# Patient Record
Sex: Female | Born: 1980 | Race: White | Hispanic: No | Marital: Married | Smoking: Former smoker
Health system: Southern US, Community
[De-identification: ages and names within clinical notes are randomized; demographics above are authoritative.]

## PROBLEM LIST (undated history)

## (undated) DIAGNOSIS — F419 Anxiety disorder, unspecified: Secondary | ICD-10-CM

## (undated) DIAGNOSIS — T7840XA Allergy, unspecified, initial encounter: Secondary | ICD-10-CM

## (undated) DIAGNOSIS — C801 Malignant (primary) neoplasm, unspecified: Secondary | ICD-10-CM

## (undated) HISTORY — PX: TONSILLECTOMY: SUR1361

## (undated) HISTORY — PX: BREAST SURGERY: SHX581

## (undated) HISTORY — DX: Malignant (primary) neoplasm, unspecified: C80.1

## (undated) HISTORY — DX: Allergy, unspecified, initial encounter: T78.40XA

## (undated) HISTORY — DX: Anxiety disorder, unspecified: F41.9

---

## 2004-12-18 ENCOUNTER — Other Ambulatory Visit: Admission: RE | Admit: 2004-12-18 | Discharge: 2004-12-18 | Payer: Self-pay | Admitting: Obstetrics and Gynecology

## 2005-01-06 ENCOUNTER — Encounter: Admission: RE | Admit: 2005-01-06 | Discharge: 2005-04-06 | Payer: Self-pay | Admitting: Obstetrics and Gynecology

## 2005-11-04 ENCOUNTER — Emergency Department (HOSPITAL_COMMUNITY): Admission: EM | Admit: 2005-11-04 | Discharge: 2005-11-04 | Payer: Self-pay | Admitting: Emergency Medicine

## 2005-12-07 HISTORY — PX: BREAST REDUCTION SURGERY: SHX8

## 2005-12-23 ENCOUNTER — Other Ambulatory Visit: Admission: RE | Admit: 2005-12-23 | Discharge: 2005-12-23 | Payer: Self-pay | Admitting: Obstetrics & Gynecology

## 2006-01-01 ENCOUNTER — Ambulatory Visit: Payer: Self-pay | Admitting: Infectious Diseases

## 2006-01-15 ENCOUNTER — Ambulatory Visit: Payer: Self-pay | Admitting: Infectious Diseases

## 2007-08-09 ENCOUNTER — Encounter: Payer: Self-pay | Admitting: Internal Medicine

## 2008-11-09 ENCOUNTER — Encounter: Payer: Self-pay | Admitting: Internal Medicine

## 2008-11-12 ENCOUNTER — Encounter: Payer: Self-pay | Admitting: Internal Medicine

## 2008-11-14 ENCOUNTER — Encounter: Payer: Self-pay | Admitting: Internal Medicine

## 2009-01-10 ENCOUNTER — Encounter: Payer: Self-pay | Admitting: Internal Medicine

## 2009-02-05 ENCOUNTER — Encounter: Payer: Self-pay | Admitting: Internal Medicine

## 2009-02-05 ENCOUNTER — Telehealth: Payer: Self-pay | Admitting: Internal Medicine

## 2009-02-12 DIAGNOSIS — G43909 Migraine, unspecified, not intractable, without status migrainosus: Secondary | ICD-10-CM | POA: Insufficient documentation

## 2009-02-12 DIAGNOSIS — Z85820 Personal history of malignant melanoma of skin: Secondary | ICD-10-CM

## 2009-02-12 DIAGNOSIS — A239 Brucellosis, unspecified: Secondary | ICD-10-CM | POA: Insufficient documentation

## 2009-02-13 ENCOUNTER — Ambulatory Visit: Payer: Self-pay | Admitting: Internal Medicine

## 2009-02-13 DIAGNOSIS — K5900 Constipation, unspecified: Secondary | ICD-10-CM | POA: Insufficient documentation

## 2009-02-13 DIAGNOSIS — R1032 Left lower quadrant pain: Secondary | ICD-10-CM | POA: Insufficient documentation

## 2009-02-19 ENCOUNTER — Ambulatory Visit: Payer: Self-pay | Admitting: Internal Medicine

## 2009-02-19 ENCOUNTER — Telehealth: Payer: Self-pay | Admitting: Internal Medicine

## 2009-02-22 ENCOUNTER — Telehealth: Payer: Self-pay | Admitting: Internal Medicine

## 2010-01-02 ENCOUNTER — Emergency Department (HOSPITAL_COMMUNITY): Admission: EM | Admit: 2010-01-02 | Discharge: 2010-01-02 | Payer: Self-pay | Admitting: Emergency Medicine

## 2010-01-05 ENCOUNTER — Emergency Department (HOSPITAL_COMMUNITY): Admission: EM | Admit: 2010-01-05 | Discharge: 2010-01-05 | Payer: Self-pay | Admitting: Family Medicine

## 2010-01-09 ENCOUNTER — Emergency Department (HOSPITAL_COMMUNITY): Admission: EM | Admit: 2010-01-09 | Discharge: 2010-01-09 | Payer: Self-pay | Admitting: Family Medicine

## 2010-01-16 ENCOUNTER — Emergency Department (HOSPITAL_COMMUNITY): Admission: EM | Admit: 2010-01-16 | Discharge: 2010-01-16 | Payer: Self-pay | Admitting: Emergency Medicine

## 2010-07-08 ENCOUNTER — Encounter: Payer: Self-pay | Admitting: Emergency Medicine

## 2010-07-09 ENCOUNTER — Emergency Department (HOSPITAL_COMMUNITY): Admission: EM | Admit: 2010-07-09 | Discharge: 2010-07-09 | Payer: Self-pay | Admitting: Emergency Medicine

## 2010-07-30 ENCOUNTER — Ambulatory Visit: Payer: Self-pay | Admitting: Emergency Medicine

## 2010-07-30 DIAGNOSIS — J45909 Unspecified asthma, uncomplicated: Secondary | ICD-10-CM | POA: Insufficient documentation

## 2010-07-30 DIAGNOSIS — R0602 Shortness of breath: Secondary | ICD-10-CM

## 2010-08-01 ENCOUNTER — Ambulatory Visit: Payer: Self-pay | Admitting: Emergency Medicine

## 2010-08-04 ENCOUNTER — Ambulatory Visit: Payer: Self-pay | Admitting: Licensed Clinical Social Worker

## 2010-08-12 ENCOUNTER — Ambulatory Visit: Payer: Self-pay | Admitting: Licensed Clinical Social Worker

## 2010-09-02 ENCOUNTER — Ambulatory Visit: Payer: Self-pay | Admitting: Licensed Clinical Social Worker

## 2010-09-10 ENCOUNTER — Ambulatory Visit: Payer: Self-pay | Admitting: Licensed Clinical Social Worker

## 2010-09-17 ENCOUNTER — Ambulatory Visit: Payer: Self-pay | Admitting: Licensed Clinical Social Worker

## 2010-09-22 ENCOUNTER — Ambulatory Visit: Payer: Self-pay | Admitting: Licensed Clinical Social Worker

## 2010-10-09 ENCOUNTER — Ambulatory Visit: Payer: Self-pay | Admitting: Licensed Clinical Social Worker

## 2010-10-28 ENCOUNTER — Ambulatory Visit: Payer: Self-pay | Admitting: Licensed Clinical Social Worker

## 2010-11-25 ENCOUNTER — Ambulatory Visit: Payer: Self-pay | Admitting: Licensed Clinical Social Worker

## 2010-12-24 ENCOUNTER — Ambulatory Visit
Admission: RE | Admit: 2010-12-24 | Discharge: 2010-12-24 | Payer: Self-pay | Source: Home / Self Care | Attending: Licensed Clinical Social Worker | Admitting: Licensed Clinical Social Worker

## 2010-12-29 ENCOUNTER — Encounter: Payer: Self-pay | Admitting: Internal Medicine

## 2010-12-31 ENCOUNTER — Ambulatory Visit: Admit: 2010-12-31 | Payer: Self-pay | Admitting: Licensed Clinical Social Worker

## 2011-01-06 NOTE — Assessment & Plan Note (Signed)
Summary: EI asthma, dyspnea   Visit Type:  Follow-up Copy to:  Weber Cooks Clelia Croft M.D. Primary Provider/Referring Provider:  Weber Cooks. Clelia Croft, MD  CC:  Patient here to discuss PFT results..  History of Present Illness: 30 yo former smoker, hx exercise induced asthma dx around age 7, treated with rescue SABA successfully. Also hx panic attacks, allergies. Still uses SABA sometimes, bothersome because it can cause migraines.  Had only ever had intermittant symptoms associated with exertion. Then Aug 1 had episode that started at rest when she was bending over. Started as pain in her chest, ? pressure. Persisted for a few days, was seen and given Advair trial, Pred taper. Seemed to improve, was even better off the Advair trial. Now having some chest tightness, SOB w exertion.    ROV 07/31/10 -- returns to discuss PFT's and symptoms.   Current Medications (verified): 1)  Yaz 3-0.02 Mg Tabs (Drospirenone-Ethinyl Estradiol) .... Take As Directed 2)  Promethazine Hcl 25 Mg Tabs (Promethazine Hcl) .... Take 1 As Needed 3)  Maxalt 10 Mg Tabs (Rizatriptan Benzoate) .... Take 1 Tablet By Mouth As Needed \\par  4)  Xanax 0.25 Mg Tabs (Alprazolam) .... As Needed 5)  Ibuprofen 200 Mg Tabs (Ibuprofen) .... As Needed 6)  Yaz 3-0.02 Mg Tabs (Drospirenone-Ethinyl Estradiol) .... Once Daily 7)  Ps-100 .... Once Daily 8)  Zypan .... Once Daily 9)  Ventolin Hfa 108 (90 Base) Mcg/act Aers (Albuterol Sulfate) .... 2 Puffs Every 4 Hours As Needed  Allergies (verified): 1)  ! Codeine 2)  ! Darvocet  Vital Signs:  Patient profile:   30 year old female Height:      64 inches (162.56 cm) Weight:      134.38 pounds (61.08 kg) BMI:     23.15 O2 Sat:      96 % on Room air Temp:     97.7 degrees F (36.50 degrees C) oral Pulse rate:   77 / minute BP sitting:   110 / 80  (right arm) Cuff size:   regular  Vitals Entered By: Michel Bickers CMA (August 01, 2010 9:36 AM)  O2 Sat at Rest %:  96 O2 Flow:  Room air CC:  Patient here to discuss PFT results. Comments Medications reviewed. Daytime phone verified. Michel Bickers [facility]  August 01, 2010 9:36 AM   Physical Exam  General:  Well developed, well nourished, no acute distress. Head:  Normocephalic and atraumatic. Eyes:  PERRLA, no icterus. Nose:  No deformity, discharge,  or lesions. Mouth:  No deformity or lesions, dentition normal. Lungs:  Clear throughout to auscultation. Heart:  Regular rate and rhythm; no murmurs, rubs,  or bruits. Abdomen:  not examined Msk:  Symmetrical with no gross deformities. Normal posture. Pulses:  Normal pulses noted. Extremities:  No clubbing, cyanosis, edema or deformities noted. Neurologic:  non-focal Skin:  Intact without significant lesions or rashes. Psych:  Alert and cooperative. Normal mood and affect.   Other Orders: Est. Patient Level IV (63875)  Patient Instructions: 1)  Continue to use your loratadine once daily  2)  Be mindful of your anxiety level - it probably relates to your upper airway instability.  3)  Continue to do your yoga and stress control.  4)  We could consider performing another type of lung function testing if we need to prove that you have exercise-induced asthma some time in the future.  5)  For now continue to have your Ventolin available to use before exercise and as needed  6)  Follow up with Dr Delton Coombes as needed  Prescriptions: VENTOLIN HFA 108 (90 BASE) MCG/ACT AERS (ALBUTEROL SULFATE) 2 puffs every 4 hours as needed  #1 x 5   Entered and Authorized by:   Leslye Peer MD   Signed by:   Leslye Peer MD on 08/01/2010   Method used:   Electronically to        [facility] (retail)       [address]       [place], [place]  [id number]       Ph: [phone]       Fax: [phone]   RxID:   [id number]

## 2011-01-06 NOTE — Miscellaneous (Signed)
Summary: Orders Update pft charges  Clinical Lists Changes  Orders: Added new Service order of Carbon Monoxide diffusing w/capacity (94720) - Signed Added new Service order of Lung Volumes (94240) - Signed Added new Service order of Spirometry (Pre & Post) (94060) - Signed 

## 2011-01-06 NOTE — Assessment & Plan Note (Signed)
Summary: dyspnea, ? asthma.    Visit Type:  Initial Consult Copy to:  Self referral Primary Aiyden Lauderback/Referring Zebulon Gantt:  Weber Cooks. Clelia Croft, MD  CC:  Patient c/o increased sob and chest pain/tightness x2-3 weeks. She has a history of exercise induced asthma. 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 Also hx panic attacks, allergies. Still uses SABA sometimes, bothersome because it can cause migraines.  Had only ever had intermittant symptoms associated with exertion. Then Aug 1 had episode that started at rest when she was bending over. Started as pain in her chest, ? pressure. Persisted for a few days, was seen and given Advair trial, Pred taper. Seemed to improve, was even better off the Advair trial. Now having some chest tightness, SOB w exertion.    Preventive Screening-Counseling & Management  Alcohol-Tobacco     Alcohol drinks/day: <1     Smoking Status: quit     Packs/Day: 0.5     Year Started: 2000     Year Quit: 2006     Pack years: 3  Current Medications (verified): 1)  Yaz 3-0.02 Mg Tabs (Drospirenone-Ethinyl Estradiol) .... Take As Directed 2)  Promethazine Hcl 25 Mg Tabs (Promethazine Hcl) .... Take 1 As Needed 3)  Maxalt 10 Mg Tabs (Rizatriptan Benzoate) .... Take 1 Tablet By Mouth As Needed \\par  4)  Xanax 0.25 Mg Tabs (Alprazolam) .... As Needed 5)  Ibuprofen 200 Mg Tabs (Ibuprofen) .... As Needed 6)  Yaz 3-0.02 Mg Tabs (Drospirenone-Ethinyl Estradiol) .... Once Daily 7)  Ps-100 .... Once Daily 8)  Zypan .... Once Daily 9)  Ventolin Hfa 108 (90 Base) Mcg/act Aers (Albuterol Sulfate) .... 2 Puffs Every 4 Hours As Needed  Allergies (verified): 1)  ! Codeine 2)  ! Darvocet  Past History:  Past Surgical History: Last updated: 02/13/2009 Melanoma removed Back -2006 Mammoplasty bilateral - 6433,2951   Tonsillectomy - 2007  Family History: Last updated:  07/30/2010 Family History of Esophageal Cancer:mggm Family History of Celiac Disease:Sister Family History of Irritable Bowel Syndrome:Mother NOTE: Patient's paternal family history is unknown Advertising account executive and siblings  Past Medical History: Current Problems:  ASTHMA (ICD-493.90) CONSTIPATION (ICD-564.00) ABDOMINAL PAIN-LLQ (ICD-789.04) MIGRAINE HEADACHE (ICD-346.90) BRUCELLOSIS (ICD-023.9) MELANOMA, HX OF (ICD-V10.82)  Family History: Family History of Esophageal Cancer:mggm Family History of Celiac Disease:Sister Family History of Irritable Bowel Syndrome:Mother NOTE: Patient's paternal family history is unknown Advertising account executive and siblings  Social History: Occupation: Veterinary surgeon Patient is a former smoker.  Alcohol Use - no Daily Caffeine Use Illicit Drug Use - no married without children Patient states former smoker Packs/Day:  0.5 Pack years:  3 Alcohol drinks/day:  <1  Review of Systems       The patient complains of shortness of breath with activity, shortness of breath at rest, chest pain, and anxiety.  The patient denies productive cough, non-productive cough, coughing up blood, irregular heartbeats, acid heartburn, indigestion, loss of appetite, weight change, abdominal pain, difficulty swallowing, sore throat, tooth/dental problems, headaches, nasal congestion/difficulty breathing through nose, sneezing, itching, ear ache, depression, hand/feet swelling, joint stiffness or pain, rash, change in color of mucus, and fever.    Vital Signs:  Patient profile:   30 year old female Height:      64 inches (162.56 cm) Weight:      136.50 pounds (62.05 kg) BMI:     23.51 O2 Sat:      99 %  on Room air Temp:     98.8 degrees F (37.11 degrees C) oral Pulse rate:   99 / minute BP sitting:   130 / 90  (right arm) Cuff size:   regular  Vitals Entered By: Michel Bickers CMA (July 30, 2010 10:45 AM)  O2 Sat at Rest %:  99 O2 Flow:  Room air CC:  Patient c/o increased sob and chest pain/tightness x2-3 weeks. She has a history of exercise induced asthma. Recent ER visit. Is Patient Diabetic? No Comments Medications reviewed with the patient. Daytime phone verified. Michel Bickers CMA  July 30, 2010 10:46 AM   Physical Exam  General:  normal appearance and healthy appearing.   Head:  normocephalic and atraumatic Eyes:  conjunctiva and sclera clear Nose:  no deformity, discharge, inflammation, or lesions Mouth:  no deformity or lesions Neck:  no masses, thyromegaly, or abnormal cervical nodes Lungs:  clear in normal resp cycle and also on forced expiration.  Heart:  regular rate and rhythm, S1, S2 without murmurs, rubs, gallops, or clicks Abdomen:  not examined Msk:  no deformity or scoliosis noted with normal posture Extremities:  no clubbing, cyanosis, edema, or deformity noted Neurologic:  non-focal Skin:  intact without lesions or rashes Psych:  alert and cooperative; normal mood and affect; normal attention span and concentration   Pulmonary Function Test Date: 07/30/2010 Height (in.): 64 Gender: Female  Pre-Spirometry FVC    Value: 3.60 L/min   Pred: 3.81 L/min     % Pred: 95 % FEV1    Value: 3.00 L     Pred: 3.04 L     % Pred: 99 % FEV1/FVC  Value: 83 %     Pred: 79 %    FEF 25-75  Value: 3.16 L/min   Pred: 3.52 L/min     % Pred: 90 %  Post-Spirometry FVC    Value: 3.57 L/min   Pred: 3.81 L/min     % Pred: 94 % FEV1    Value: 3.06 L     Pred: 3.04 L     % Pred: 101 % FEV1/FVC  Value: 86 %     Pred: 79 %    FEF 25-75  Value: 3.60 L/min   Pred: 3.52 L/min     % Pred: 102 %  Lung Volumes TLC    Value: 4.70 L   % Pred: 91 % RV    Value: 1.10 L   % Pred: 70 % DLCO    Value: 17.1 %   % Pred: 75 % DLCO/VA  Value: 4.18 %   % Pred: 93 %  Impression & Recommendations:  Problem # 1:  DYSPNEA (ICD-786.05)  Orders: Consultation Level IV (30865)  Problem # 2:  ASTHMA (ICD-493.90)  Medications Added to Medication  List This Visit: 1)  Yaz 3-0.02 Mg Tabs (Drospirenone-ethinyl estradiol) .... Once daily 2)  Ps-100  .... Once daily 3)  Zypan  .... Once daily 4)  Ventolin Hfa 108 (90 Base) Mcg/act Aers (Albuterol sulfate) .... 2 puffs every 4 hours as needed  Patient Instructions: 1)  We will perform full Pulmonary function tests today 2)  Follow up with Dr Delton Coombes next available after your testing to review 3)  Do not restart Advair 4)  Continue to have either xopenex or albuterol available to use as needed

## 2011-01-09 ENCOUNTER — Ambulatory Visit (INDEPENDENT_AMBULATORY_CARE_PROVIDER_SITE_OTHER): Payer: Medicare HMO | Admitting: Licensed Clinical Social Worker

## 2011-01-09 DIAGNOSIS — F4323 Adjustment disorder with mixed anxiety and depressed mood: Secondary | ICD-10-CM

## 2011-01-14 ENCOUNTER — Ambulatory Visit (INDEPENDENT_AMBULATORY_CARE_PROVIDER_SITE_OTHER): Payer: Medicare HMO | Admitting: Licensed Clinical Social Worker

## 2011-01-14 DIAGNOSIS — F331 Major depressive disorder, recurrent, moderate: Secondary | ICD-10-CM

## 2011-01-14 DIAGNOSIS — F411 Generalized anxiety disorder: Secondary | ICD-10-CM

## 2011-02-20 LAB — POCT I-STAT, CHEM 8
BUN: 6 mg/dL (ref 6–23)
Calcium, Ion: 1.14 mmol/L (ref 1.12–1.32)
Chloride: 105 mEq/L (ref 96–112)
Creatinine, Ser: 0.9 mg/dL (ref 0.4–1.2)
Glucose, Bld: 95 mg/dL (ref 70–99)
HCT: 42 % (ref 36.0–46.0)
Hemoglobin: 14.3 g/dL (ref 12.0–15.0)
Potassium: 3.6 mEq/L (ref 3.5–5.1)
Sodium: 137 mEq/L (ref 135–145)
TCO2: 22 mmol/L (ref 0–100)

## 2011-02-20 LAB — DIFFERENTIAL
Basophils Absolute: 0.1 10*3/uL (ref 0.0–0.1)
Basophils Relative: 1 % (ref 0–1)
Eosinophils Absolute: 0.1 10*3/uL (ref 0.0–0.7)
Eosinophils Relative: 1 % (ref 0–5)
Lymphocytes Relative: 33 % (ref 12–46)
Lymphs Abs: 3.3 10*3/uL (ref 0.7–4.0)
Monocytes Absolute: 0.4 10*3/uL (ref 0.1–1.0)
Monocytes Relative: 4 % (ref 3–12)
Neutro Abs: 6.4 10*3/uL (ref 1.7–7.7)
Neutrophils Relative %: 62 % (ref 43–77)

## 2011-02-20 LAB — CBC
HCT: 39.3 % (ref 36.0–46.0)
Hemoglobin: 13.2 g/dL (ref 12.0–15.0)
MCH: 29.3 pg (ref 26.0–34.0)
MCHC: 33.6 g/dL (ref 30.0–36.0)
MCV: 87.1 fL (ref 78.0–100.0)
Platelets: 321 10*3/uL (ref 150–400)
RBC: 4.51 MIL/uL (ref 3.87–5.11)
RDW: 13.5 % (ref 11.5–15.5)
WBC: 10.3 10*3/uL (ref 4.0–10.5)

## 2011-03-19 LAB — GLUCOSE, CAPILLARY: Glucose-Capillary: 83 mg/dL (ref 70–99)

## 2011-08-21 IMAGING — CT CT ANGIO CHEST
2 of 6 series · 19 of 36 positions shown · IV contrast (APPLIED)
Comparison: 11/04/2005

CLINICAL DATA: 28-year-old female with shortness of breath

CT ANGIOGRAPHY CHEST WITH CONTRAST
TECHNIQUE: Multidetector CT imaging of the chest was performed
using the standard protocol during bolus administration of
intravenous contrast.  Multiplanar CT image reconstructions
including MIPs were obtained to evaluate the vascular anatomy.
Contrast:  87 ml of Jmnipaque-ZQQ

[Series 9: pulm embolism 1.0 b25f thins · axial · 0.67mm/px · z∈[+1194,+1410]mm · 18 of 241 slices shown]
[im 13/241  lung]
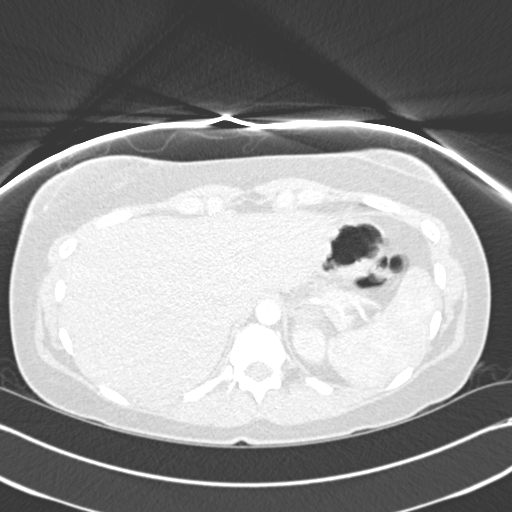
[im 25/241  mediastinal]
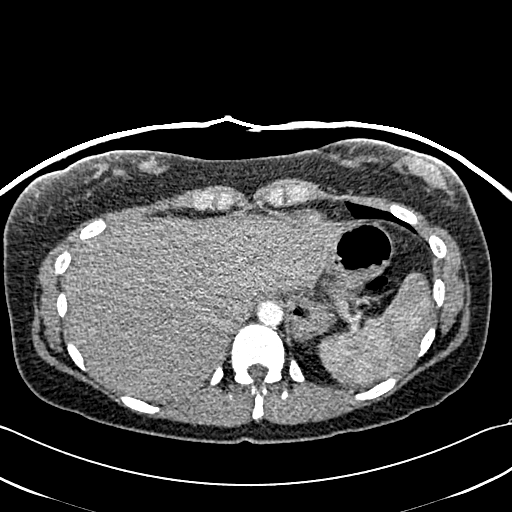
[im 37/241  lung]
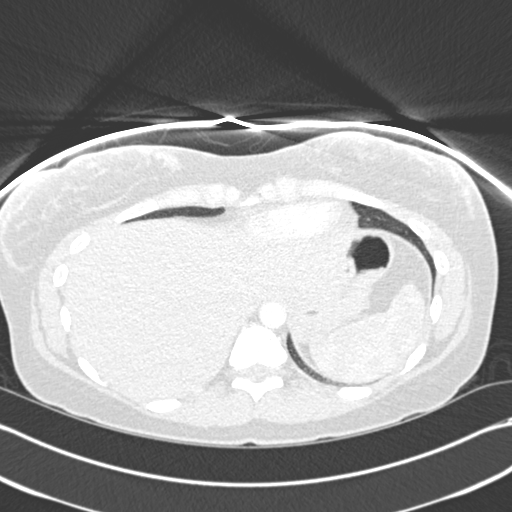
[im 49/241  mediastinal]
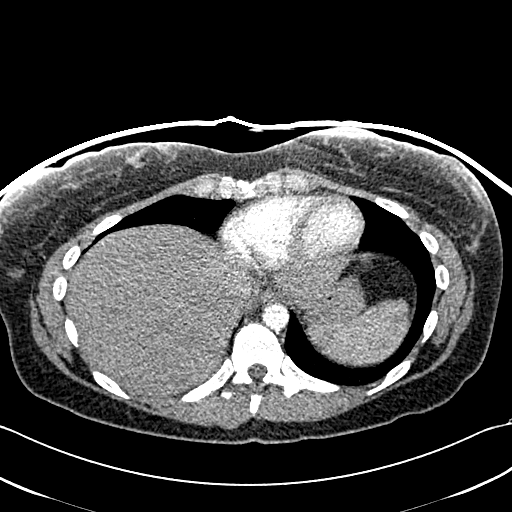
[im 61/241  lung]
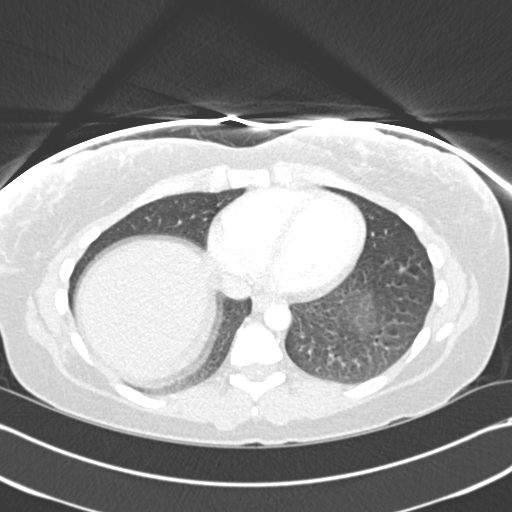
[im 73/241  mediastinal]
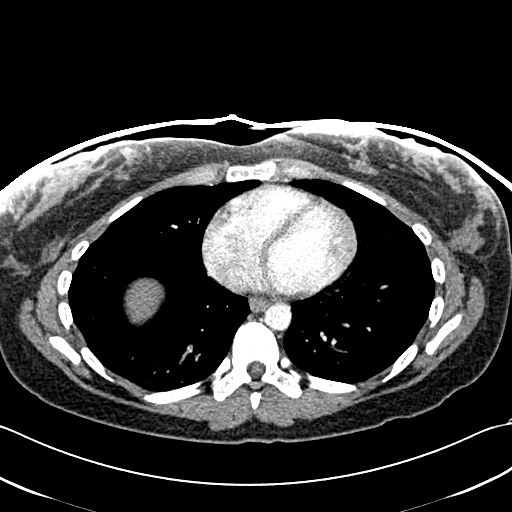
[im 85/241  lung]
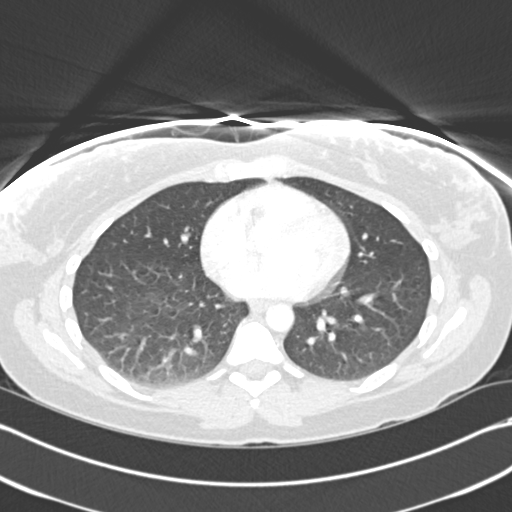
[im 97/241  mediastinal]
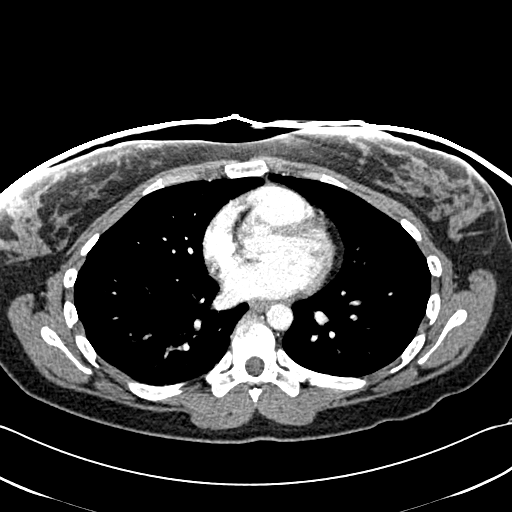
[im 109/241  lung]
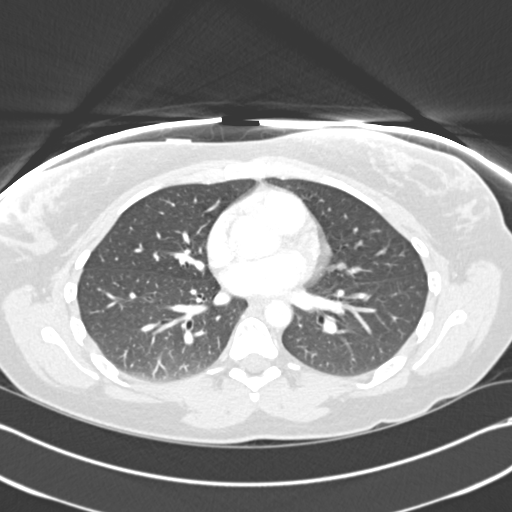
[im 133/241  mediastinal]
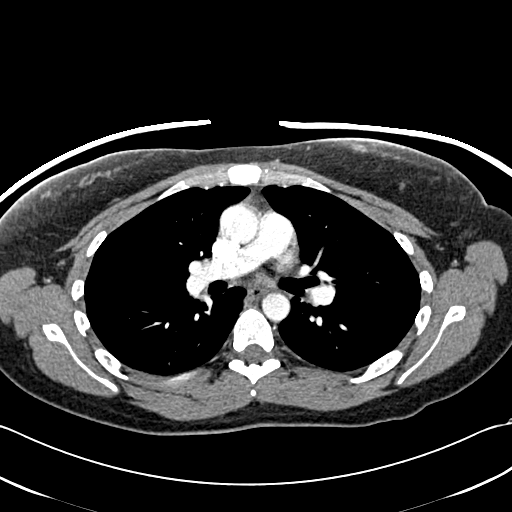
[im 145/241  lung]
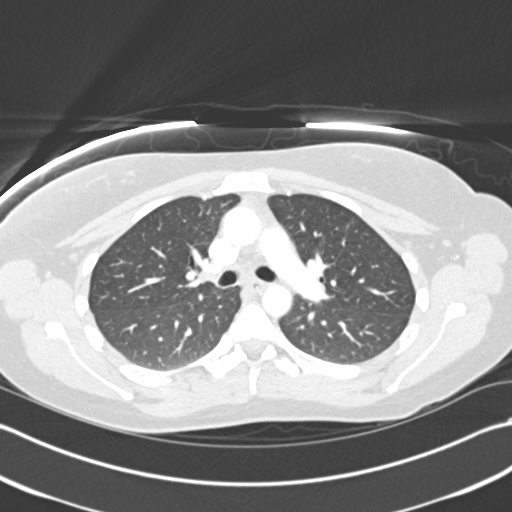
[im 157/241  mediastinal]
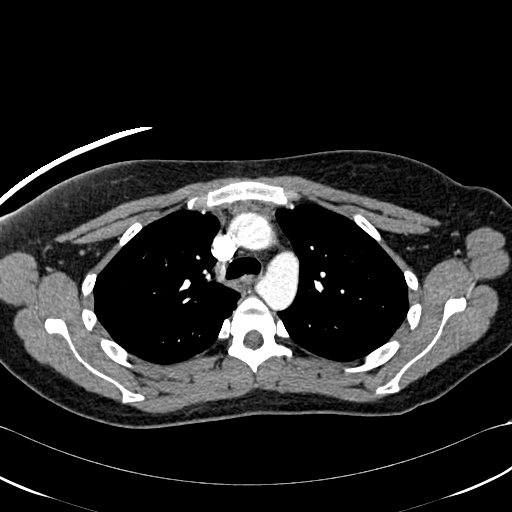
[im 169/241  lung]
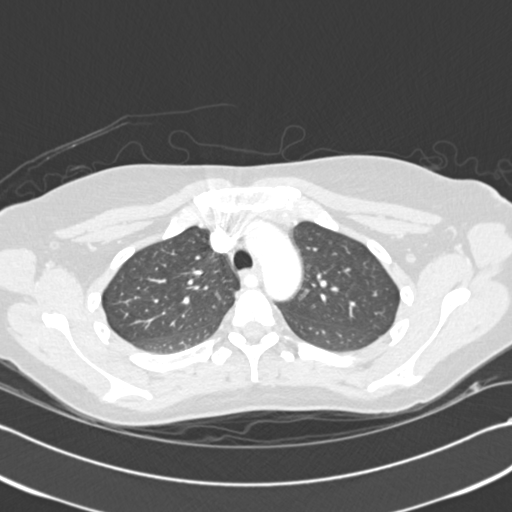
[im 181/241  mediastinal]
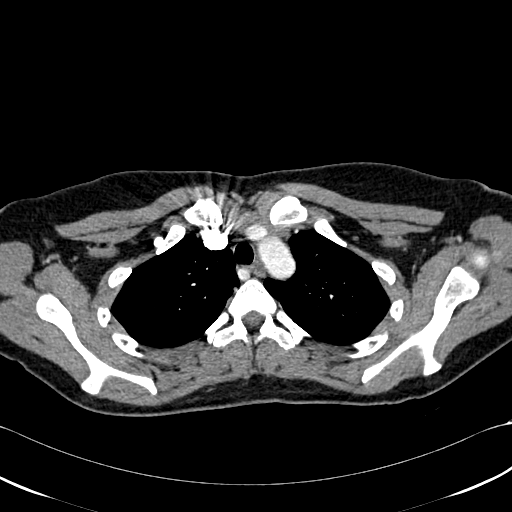
[im 193/241  lung]
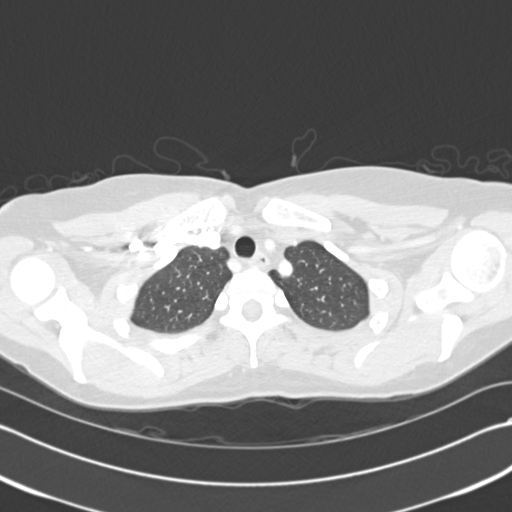
[im 205/241  mediastinal]
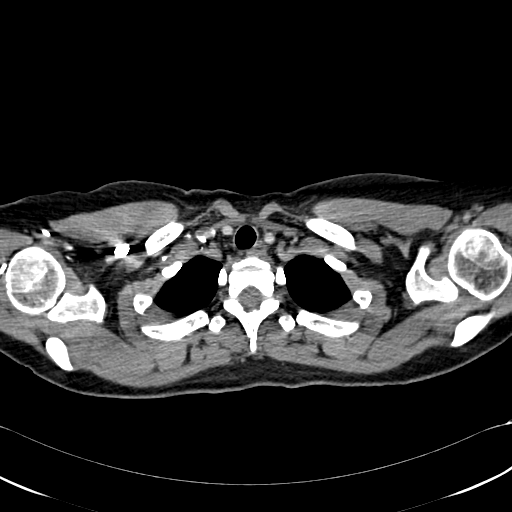
[im 217/241  lung]
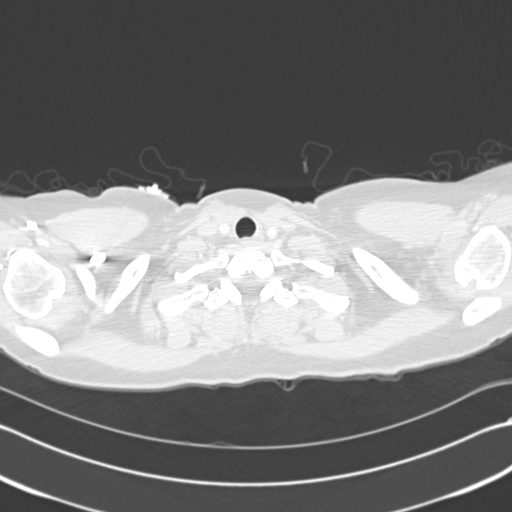
[im 229/241  mediastinal]
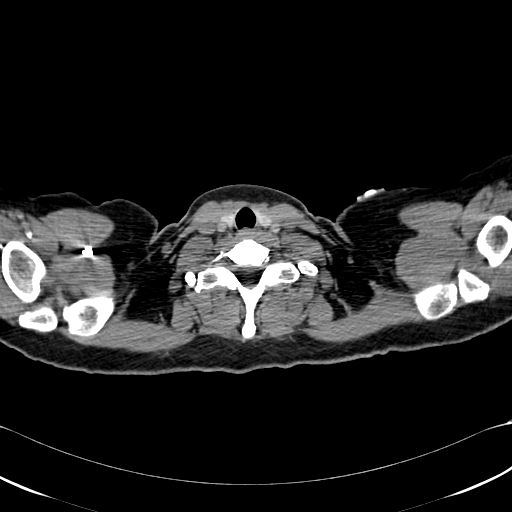

[Series 604: coronal · coronal · 0.67mm/px · 1 of 61 slices shown]
[im 31/61  mediastinal]
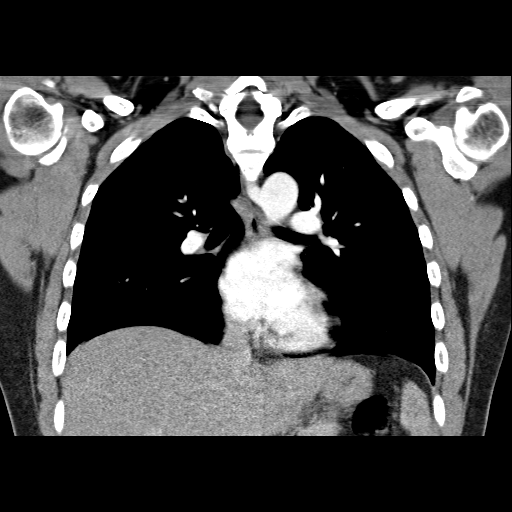

[19 of 36 positions shown; findings below may reference images not displayed]

FINDINGS: The thyroid is normal.  The heart is normal in size
without pericardial effusion.  The aorta and major branch vessels
are patent.  Incidental note is made of aberrant right subclavian
artery.  The pulmonary artery is normal in size without filling
defects seen in the central, segmental or subsegmental pulmonary
arterial tree.

The trachea and central airways are patent.  The lungs are clear
bilaterally.  No pleural effusion is seen.

The upper abdomen is normal appearance.

The osseous structures are within normal limits.  No aggressive
lytic or blastic lesions are seen.

Review of the MIP images confirms the above findings.
IMPRESSION: 1.  No acute findings on today's exam.  Specifically, no evidence
of pulmonary embolism.
2.  Aberrant right subclavian artery detailed above.

## 2011-12-19 ENCOUNTER — Ambulatory Visit (INDEPENDENT_AMBULATORY_CARE_PROVIDER_SITE_OTHER): Payer: 59

## 2011-12-19 DIAGNOSIS — J0100 Acute maxillary sinusitis, unspecified: Secondary | ICD-10-CM

## 2012-01-20 ENCOUNTER — Ambulatory Visit (INDEPENDENT_AMBULATORY_CARE_PROVIDER_SITE_OTHER): Payer: 59 | Admitting: Family Medicine

## 2012-01-20 DIAGNOSIS — F439 Reaction to severe stress, unspecified: Secondary | ICD-10-CM

## 2012-01-20 DIAGNOSIS — J0100 Acute maxillary sinusitis, unspecified: Secondary | ICD-10-CM

## 2012-01-20 DIAGNOSIS — M542 Cervicalgia: Secondary | ICD-10-CM

## 2012-01-20 DIAGNOSIS — I10 Essential (primary) hypertension: Secondary | ICD-10-CM

## 2012-01-20 DIAGNOSIS — G8929 Other chronic pain: Secondary | ICD-10-CM

## 2012-01-20 MED ORDER — CYCLOBENZAPRINE HCL 10 MG PO TABS: 10.0000 mg | ORAL_TABLET | Freq: Every evening | ORAL | Status: AC | PRN

## 2012-01-20 MED ORDER — DICLOFENAC EPOLAMINE 1.3 % TD PTCH: 1.0000 | MEDICATED_PATCH | Freq: Two times a day (BID) | TRANSDERMAL | Status: DC

## 2012-01-20 MED ORDER — MELOXICAM 7.5 MG PO TABS: 7.5000 mg | ORAL_TABLET | Freq: Two times a day (BID) | ORAL | Status: DC | PRN

## 2012-01-20 NOTE — Progress Notes (Signed)
  Subjective:    Patient ID: Rose Harrison, female    DOB: 1981/05/28, 31 y.o.   MRN: [id number]  HPI  Presents c/o recurrent neck spasms.  Her most recent flair started on Saturday.  Spasms were initially  on the right side of her neck, however yesterday began to have spasms involving  her left side.  The patient has used Robaxin and Flexeril in the past, flexeril being the more effective agent.  In the past for her muscular symptoms patient has sought care from chiropractors and rolfing therapists.   Now working with a body work therapist who encouraged the past to get a prescription for flexeril.   She is using a variety of naturalapathic medications to include glucosamine and tumeric.   Lots of emotional stress recently.  Denies radicular symptoms to include paresthesias or weakness in her upper extremities.  Has seen Dr. Ethelene Hal in the past for SI joint dysfunction and flector patches prescribed  PMH / Malignant melanoma surgically excised from mid back in the past.   Review of Systems     Objective:   Physical Exam  Constitutional: She appears well-developed and well-nourished.  Neck: Neck supple.  Cardiovascular: Normal rate, regular rhythm and normal heart sounds.   Pulmonary/Chest: Effort normal and breath sounds normal.  Abdominal: Soft. Bowel sounds are normal.  Musculoskeletal:       Cervical back: She exhibits tenderness (along paraspinal muscles, SCM and trapezius muscles) and spasm. She exhibits normal range of motion and no bony tenderness.       Legs: Neurological: She is alert. She has normal strength. No cranial nerve deficit or sensory deficit.  Reflex Scores:      Bicep reflexes are 2+ on the right side and 2+ on the left side.      Brachioradialis reflexes are 2+ on the right side and 2+ on the left side. Skin: Skin is warm.      Assessment & Plan:   1. Neck pain, muscular  meloxicam (MOBIC) 7.5 MG tablet, cyclobenzaprine (FLEXERIL) 10 MG tablet,  diclofenac (FLECTOR) 1.3 % PTCH  2. Chronic SI joint pain  meloxicam (MOBIC) 7.5 MG tablet, cyclobenzaprine (FLEXERIL) 10 MG tablet, diclofenac (FLECTOR) 1.3 % PTCH  3. Situational stress     Anticipatory guidance. Follow up if symptoms persist or worsen and we'll obtain cervical spine films.  Do not feel they are indicated At this time given normal neurological exam

## 2012-01-22 ENCOUNTER — Encounter: Payer: Self-pay | Admitting: Family Medicine

## 2012-02-09 ENCOUNTER — Other Ambulatory Visit: Payer: Self-pay | Admitting: Family Medicine

## 2012-02-10 NOTE — Telephone Encounter (Signed)
Refill authorized.

## 2012-02-17 ENCOUNTER — Telehealth: Payer: Self-pay

## 2012-02-17 NOTE — Telephone Encounter (Signed)
.  [facility] PT STATES SHE WOULD LIKE TO HAVE 2 EMERGENCY MEDICINES THAT THE DR NEVER GAVE HER HERE, BUT SHE NEED [place] TO CALL IN FOR HER PLEASE CALL [phone]   DOESN'T HAVE THE PHARMACY YET, BUT WILL KNOW WHERE TO CALL WHEN SHE GETS A CALL BACK

## 2012-02-19 ENCOUNTER — Other Ambulatory Visit: Payer: Self-pay | Admitting: Family Medicine

## 2012-02-19 DIAGNOSIS — T782XXA Anaphylactic shock, unspecified, initial encounter: Secondary | ICD-10-CM

## 2012-02-19 DIAGNOSIS — Z91030 Bee allergy status: Secondary | ICD-10-CM

## 2012-02-19 MED ORDER — EPINEPHRINE 0.3 MG/0.3ML IJ DEVI: 0.3000 mg | Freq: Once | INTRAMUSCULAR | Status: AC

## 2012-02-19 MED ORDER — LEVALBUTEROL TARTRATE 45 MCG/ACT IN AERO: 1.0000 | INHALATION_SPRAY | RESPIRATORY_TRACT | Status: AC | PRN

## 2012-02-19 NOTE — Telephone Encounter (Signed)
Allergic to bees.  Did refills for her

## 2012-02-19 NOTE — Telephone Encounter (Signed)
LMOM that Rxs were sent into [facility]

## 2012-02-19 NOTE — Telephone Encounter (Signed)
Would like you to write her a prescription for an Epi pen and a xopenex inhaler.  Prescription can be sent to [facility] pharmacy.

## 2012-05-13 ENCOUNTER — Telehealth: Payer: Self-pay

## 2012-05-13 ENCOUNTER — Other Ambulatory Visit: Payer: Self-pay | Admitting: Physician Assistant

## 2012-05-13 MED ORDER — CYCLOBENZAPRINE HCL 5 MG PO TABS: 5.0000 mg | ORAL_TABLET | Freq: Three times a day (TID) | ORAL | Status: AC | PRN

## 2012-05-13 MED ORDER — MELOXICAM 7.5 MG PO TABS: 7.5000 mg | ORAL_TABLET | Freq: Two times a day (BID) | ORAL | Status: DC

## 2012-05-13 NOTE — Telephone Encounter (Signed)
Pt is requesting anti inflamatory meds for muscle spasms.  [facility]  Previously sent to [facility]  Patient will contact the pharmacy to have them send over a request.  Patient went to chiropractor = the pain has caused her to have a TMJ.

## 2012-05-13 NOTE — Telephone Encounter (Signed)
2 Rxs ready to fax to [facility]

## 2012-05-14 NOTE — Telephone Encounter (Signed)
Faxed and patient notified.  Actually wanted them sent to [facility], so she will call and transfer them.

## 2012-06-23 ENCOUNTER — Ambulatory Visit (INDEPENDENT_AMBULATORY_CARE_PROVIDER_SITE_OTHER): Payer: 59 | Admitting: Family Medicine

## 2012-06-23 VITALS — BP 118/68 | HR 98 | Temp 98.4°F | Resp 16 | Ht 63.5 in | Wt 143.0 lb

## 2012-06-23 DIAGNOSIS — J329 Chronic sinusitis, unspecified: Secondary | ICD-10-CM

## 2012-06-23 MED ORDER — FLUCONAZOLE 150 MG PO TABS: 150.0000 mg | ORAL_TABLET | Freq: Once | ORAL | Status: AC

## 2012-06-23 MED ORDER — CEFDINIR 300 MG PO CAPS: 300.0000 mg | ORAL_CAPSULE | Freq: Two times a day (BID) | ORAL | Status: AC

## 2012-06-23 NOTE — Progress Notes (Signed)
[facility] [address], [place] [address] [phone]  Date:  06/23/2012   Name:  Rose Harrison   DOB:  May 13, 1981   MRN:  [id number]  PCP:  No primary provider on file.    Chief Complaint: Sinusitis   History of Present Illness:  Rose Harrison is a 31 y.o. very pleasant female patient who presents with the following:  Her husband has been ill recently. She has noted sinus congestion, drainage, and purulent sinus drainage for about a week.  She is getting worse instead of better.  OTC medications are not helping much.  She is having drainage and a productive cough. She also notes a ST in the morning.  She has noted hot flushes but no chills or aches.  No GI symptoms.   She is on a 3 month OCP.  She uses the extended regimen to help cut down on her menstrual migraine HA.    Patient Active Problem List  Diagnosis  . BRUCELLOSIS  . MIGRAINE HEADACHE  . ASTHMA  . CONSTIPATION  . DYSPNEA  . ABDOMINAL PAIN-LLQ  . MELANOMA, HX OF    No past medical history on file.  No past surgical history on file.  History  Substance Use Topics  . Smoking status: Former Games developer  . Smokeless tobacco: Not on file  . Alcohol Use: Yes     rarely since Oct. 2012    No family history on file.  Allergies  Allergen Reactions  . Codeine     REACTION: Hives  . Propoxyphene-Acetaminophen     REACTION: hives  . Doxycycline Nausea And Vomiting and Rash    Medication list has been reviewed and updated.  Current Outpatient Prescriptions on File Prior to Visit  Medication Sig Dispense Refill  . drospirenone-ethinyl estradiol (YAZ,GIANVI,LORYNA) 3-0.02 MG tablet Take 1 tablet by mouth daily.      Marland Kitchen EPINEPHrine (EPIPEN) 0.3 mg/0.3 mL DEVI Inject 0.3 mLs (0.3 mg total) into the muscle once.  1 Device  2  . levalbuterol (XOPENEX HFA) 45 MCG/ACT inhaler Inhale 1-2 puffs into the lungs every 4 (four) hours as needed for wheezing.  1 Inhaler  12  . meloxicam (MOBIC) 7.5 MG  tablet Take 1 tablet (7.5 mg total) by mouth 2 (two) times daily.  60 tablet  0  . diclofenac (FLECTOR) 1.3 % PTCH Place 1 patch onto the skin 2 (two) times daily.  5 patch  1    Review of Systems:  As per HPI- otherwise negative.   Physical Examination: Filed Vitals:   06/23/12 1258  BP: 118/68  Pulse: 98  Temp: 98.4 F (36.9 C)  Resp: 16   Filed Vitals:   06/23/12 1258  Height: 5' 3.5" (1.613 m)  Weight: 143 lb (64.864 kg)   Body mass index is 24.93 kg/(m^2). Ideal Body Weight: Weight in (lb) to have BMI = 25: 143.1   GEN: WDWN, NAD, Non-toxic, A & O x 3 HEENT: Atraumatic, Normocephalic. Neck supple. No masses, No LAD.  Tm and oropharynx wnl, nasal cavity congested, tender sinuses Ears and Nose: No external deformity. CV: RRR, No M/G/R. No JVD. No thrill. No extra heart sounds. PULM: CTA B, no wheezes, crackles, rhonchi. No retractions. No resp. distress. No accessory muscle use EXTR: No c/c/e NEURO Normal gait.  PSYCH: Normally interactive. Conversant. Not depressed or anxious appearing.  Calm demeanor.    Assessment and Plan: 1. Sinusitis  cefdinir (OMNICEF) 300 MG capsule, fluconazole (DIFLUCAN) 150 MG  tablet   Treat for sinusitis as above.  Patient (or parent if minor) instructed to return to clinic or call if not better in 3-4 day(s). Sooner if worse.   Gave diflucan for possible yeast vaginitis that tends to occur when she takes abx.     Abbe Amsterdam, MD

## 2012-07-12 ENCOUNTER — Other Ambulatory Visit: Payer: Self-pay | Admitting: Family Medicine

## 2012-07-13 NOTE — Telephone Encounter (Signed)
Chart pulled °

## 2012-09-06 ENCOUNTER — Other Ambulatory Visit: Payer: Self-pay | Admitting: Physician Assistant

## 2012-09-07 ENCOUNTER — Other Ambulatory Visit: Payer: Self-pay | Admitting: Radiology

## 2012-09-10 ENCOUNTER — Other Ambulatory Visit: Payer: Self-pay

## 2012-10-13 ENCOUNTER — Telehealth: Payer: Self-pay | Admitting: Physician Assistant

## 2012-10-13 ENCOUNTER — Ambulatory Visit (INDEPENDENT_AMBULATORY_CARE_PROVIDER_SITE_OTHER): Payer: BC Managed Care – PPO | Admitting: Internal Medicine

## 2012-10-13 VITALS — BP 117/79 | HR 78 | Temp 98.4°F | Resp 16 | Ht 64.0 in | Wt 148.0 lb

## 2012-10-13 DIAGNOSIS — J019 Acute sinusitis, unspecified: Secondary | ICD-10-CM

## 2012-10-13 DIAGNOSIS — R51 Headache: Secondary | ICD-10-CM

## 2012-10-13 MED ORDER — FLUTICASONE PROPIONATE 50 MCG/ACT NA SUSP: 2.0000 | Freq: Every day | NASAL | Status: DC

## 2012-10-13 MED ORDER — AMOXICILLIN 500 MG PO CAPS: 1000.0000 mg | ORAL_CAPSULE | Freq: Two times a day (BID) | ORAL | Status: DC

## 2012-10-13 MED ORDER — FLUCONAZOLE 150 MG PO TABS: 150.0000 mg | ORAL_TABLET | Freq: Once | ORAL | Status: DC

## 2012-10-13 NOTE — Patient Instructions (Signed)

## 2012-10-13 NOTE — Progress Notes (Signed)
  Subjective:    Patient ID: Rose Harrison, female    DOB: [birth year], 31 y.o.   MRN: [id number]  HPI Facial pain and pressure, green discharge, travel   Review of Systems     Objective:   Physical Exam  Vitals reviewed. Constitutional: She is oriented to person, place, and time. She appears well-developed and well-nourished.  HENT:  Head: Normocephalic.  Nose: Mucosal edema and sinus tenderness present. No septal deviation. Left sinus exhibits maxillary sinus tenderness and frontal sinus tenderness.  Eyes: EOM are normal.  Neck: Neck supple.  Cardiovascular: Normal rate.   Pulmonary/Chest: Effort normal.  Neurological: She is alert and oriented to person, place, and time. Coordination normal.  Psychiatric: She has a normal mood and affect.          Assessment & Plan:  Sinusitis

## 2012-10-13 NOTE — Telephone Encounter (Signed)
Pt called stating she saw Dr. Perrin Maltese today who said he was going to prescribe three things for her - abx, diflucan, and fluticasone - but only the antibiotic was at the pharmacy.  Have sent Diflucan and fluticasone.

## 2012-11-23 ENCOUNTER — Other Ambulatory Visit: Payer: Self-pay | Admitting: Otolaryngology

## 2012-11-23 ENCOUNTER — Other Ambulatory Visit: Payer: Self-pay | Admitting: Physician Assistant

## 2012-11-23 DIAGNOSIS — R51 Headache: Secondary | ICD-10-CM

## 2012-11-23 DIAGNOSIS — G43909 Migraine, unspecified, not intractable, without status migrainosus: Secondary | ICD-10-CM

## 2012-12-05 ENCOUNTER — Other Ambulatory Visit: Payer: Self-pay

## 2013-02-17 ENCOUNTER — Telehealth: Payer: Self-pay | Admitting: Radiology

## 2013-02-17 ENCOUNTER — Ambulatory Visit
Admission: RE | Admit: 2013-02-17 | Discharge: 2013-02-17 | Disposition: A | Discharge status: Home / Self Care | Payer: BC Managed Care – PPO | Source: Ambulatory Visit | Attending: Family Medicine | Admitting: Family Medicine

## 2013-02-17 ENCOUNTER — Ambulatory Visit (INDEPENDENT_AMBULATORY_CARE_PROVIDER_SITE_OTHER): Payer: BC Managed Care – PPO | Admitting: Family Medicine

## 2013-02-17 VITALS — BP 118/72 | HR 78 | Temp 98.7°F | Resp 16 | Ht 64.0 in | Wt 152.0 lb

## 2013-02-17 DIAGNOSIS — F329 Major depressive disorder, single episode, unspecified: Secondary | ICD-10-CM

## 2013-02-17 MED ORDER — PREDNISONE 20 MG PO TABS: ORAL_TABLET | ORAL | Status: DC

## 2013-02-17 MED ORDER — RIZATRIPTAN BENZOATE 10 MG PO TBDP: ORAL_TABLET | ORAL | Status: AC

## 2013-02-17 MED ORDER — BUPROPION HCL ER (SR) 150 MG PO TB12: 150.0000 mg | ORAL_TABLET | Freq: Two times a day (BID) | ORAL | Status: DC

## 2013-02-17 MED ORDER — CYCLOBENZAPRINE HCL 10 MG PO TABS: 10.0000 mg | ORAL_TABLET | Freq: Two times a day (BID) | ORAL | Status: AC | PRN

## 2013-02-17 MED ORDER — MELOXICAM 7.5 MG PO TABS: 7.5000 mg | ORAL_TABLET | Freq: Two times a day (BID) | ORAL | Status: AC

## 2013-02-17 NOTE — Progress Notes (Signed)
[facility], [place] [address] [place] [address] [phone]  Date:  02/17/2013   Name:  Rose Harrison   DOB:  [birth year]   MRN:  [id number]  PCP:  Rose Amsterdam, MD    Chief Complaint: Headache, Medication Refill and Anxiety   History of Present Illness:  Rose Harrison is a 32 y.o. very pleasant female patient who presents with the following:  Last seen my myself last summer for an illness.  History of anxiety, migraine HA, and melanoma.    She has noted a problem with increase in her migraine HA lately.  She has seen Dr. Vela Harrison for these, and he thought her problem might be related to sinusitis.  She then saw Dr. Jenne Harrison who wanted to do a CT but she could not afford it.    She notes 5 migraine HA in the last 2 weeks- she usually just gets 1 a month or so.   She uses malalt as needed. Today is Friday. This past Tuesday she had to use maxalt and flexeril- this did not help, but she took another maxalt and felt better.  Then yesterday she took a mobic, and he HA seemed to get worse. She took another mobic and flexeril last night, and is using some sort of topical "aching head relief" herbal remedy.   She usually uses flexeril as needed for "tension in my neck that triggers migraine", and maxalt.  She has trouble with her neck and back- also sees a Rose Harrison.  She does note some pressure in her sinuses today.   She has only had nausea when the HA is more severe.  She is sound and light sensitive.   No vomiting.  She does have zofran to use as needed.  No aura.    She and her husband are also doing marriage counseling, and she was told that she might have some stress/ depression issues.    She feels amotivated, has a hard time getting things done at home.  She feels tired, does not feel like going to work, can be irritable, not sleeping well, decreased appetite.  She is having a hard time with binge eating as well.  She is "feeling down a lot" for several months- since  about October of 2013.  Her friends, framily and work associates have noted a problem as well.    PO steroids have helped with her HA in the past when they would not stop with her normal medicatons  She tried some anti- depressant medication in the past- used for anxiety when she was in college.  She tried wellbutrin and also topamax. She is no longer having anxiety attacks.    She has not spoken with Dr. Vela Harrison yet about her increased HA.  She last saw Dr. Vela Harrison about 4 months ago.    Normally her OCP has helped a lot with her migraine HA- she takes 3 packs continuously but is on her placebo week an does have her menses today.    Patient Active Problem List  Diagnosis  . BRUCELLOSIS  . MIGRAINE HEADACHE  . ASTHMA  . CONSTIPATION  . DYSPNEA  . ABDOMINAL PAIN-LLQ  . MELANOMA, HX OF    Past Medical History  Diagnosis Date  . Allergy   . Cancer   . Anxiety     Past Surgical History  Procedure Laterality Date  . Breast surgery    . Tonsillectomy    . Breast reduction surgery  2007  History  Substance Use Topics  . Smoking status: Former Games developer  . Smokeless tobacco: Not on file  . Alcohol Use: Yes     Comment: rarely since Oct. 2012    Family History  Problem Relation Age of Onset  . Epilepsy Maternal Grandmother   . Hypertension Maternal Grandmother   . Hyperlipidemia Maternal Grandmother     Allergies  Allergen Reactions  . Codeine     REACTION: Hives  . Propoxyphene-Acetaminophen     REACTION: hives  . Doxycycline Nausea And Vomiting and Rash    Medication list has been reviewed and updated.  Current Outpatient Prescriptions on File Prior to Visit  Medication Sig Dispense Refill  . drospirenone-ethinyl estradiol (YAZ,GIANVI,LORYNA) 3-0.02 MG tablet Take 1 tablet by mouth daily.      Marland Kitchen EPINEPHrine (EPIPEN) 0.3 mg/0.3 mL DEVI Inject 0.3 mLs (0.3 mg total) into the muscle once.  1 Device  2  . levalbuterol (XOPENEX HFA) 45 MCG/ACT inhaler Inhale 1-2  puffs into the lungs every 4 (four) hours as needed for wheezing.  1 Inhaler  12  . meloxicam (MOBIC) 7.5 MG tablet Take 1 tablet (7.5 mg total) by mouth 2 (two) times daily.  60 tablet  0  . rizatriptan (MAXALT-MLT) 10 MG disintegrating tablet TAKE 1 TABLET AT ONSET OF HEADACHE, REPEAT IN 2 HOURS AS NEEDED (MAX 2 PER 24 HOURS)  6 tablet  0   No current facility-administered medications on file prior to visit.    Review of Systems:  As per HPI- otherwise negative.   Physical Examination: Filed Vitals:   02/17/13 1310  BP: 118/72  Pulse: 78  Temp: 98.7 F (37.1 C)  Resp: 16   Filed Vitals:   02/17/13 1310  Height: 5\' 4"  (1.626 m)  Weight: 152 lb (68.947 kg)   Body mass index is 26.08 kg/(m^2). Ideal Body Weight: Weight in (lb) to have BMI = 25: 145.3  GEN: WDWN, NAD, Non-toxic, A & O x 3, looks well HEENT: Atraumatic, Normocephalic. Neck supple. No masses, No LAD.  Bilateral TM wnl, oropharynx normal.  PEERL,EOMI.   Ears and Nose: No external deformity. CV: RRR, No M/G/R. No JVD. No thrill. No extra heart sounds. PULM: CTA B, no wheezes, crackles, rhonchi. No retractions. No resp. distress. No accessory muscle use. ABD: S, NT, ND EXTR: No c/c/e NEURO Normal gait. Normal complete neuro exam including strength, sensation and DTR all extremities, and negative romberg PSYCH: Normally interactive. Conversant. Not depressed or anxious appearing.  Calm demeanor.   Discussed with Rose Harrison- she would like to do a CT today to evaluate her increased HA and possible sinus issues  CT HEAD WITHOUT CONTRAST  Technique: Contiguous axial images were obtained from the base of the skull through the vertex without contrast  Comparison: None.  Findings: The brain has a normal appearance without evidence for hemorrhage, acute infarction, hydrocephalus, or mass lesion. There is no extra axial fluid collection. The skull and paranasal sinuses are normal.  IMPRESSION: Normal CT of the head  without contrast.  She last took mobic yesterday and would like to try a course of prednisone- explained that although there is not definite evidence that this will help, as it has helped in the past we can try.    Assessment and Plan: Headache, migraine - Plan: CT Head Wo Contrast, rizatriptan (MAXALT-MLT) 10 MG disintegrating tablet, meloxicam (MOBIC) 7.5 MG tablet, cyclobenzaprine (FLEXERIL) 10 MG tablet, predniSONE (DELTASONE) 20 MG tablet  Depression - Plan: buPROPion (WELLBUTRIN SR)  150 MG 12 hr tablet  Discussed with Rose Harrison on the phone once her CT came back.  CT is normal- head and visualized sinuses are normal. She would like to try a course of prednisone to try and break her HA cycle. I also refilled her other medications today.  She knows not to take mobic while she is on her prednisone.  Called pharmacy regarding possible increased seizure risk with combination of wellbutrin and flexeril- this risk is low and Rose Harrison feels ok with the risk.    She will complete her course of prednisone, and then may start on her wellbutin for depresion once she has finished the prednisone.  She is to let me know if not feeling better in the next day or so- Sooner if worse.   Meds ordered this encounter  Medications  . DISCONTD: cyclobenzaprine (FLEXERIL) 10 MG tablet    Sig: Take 10 mg by mouth as needed for muscle spasms.  . rizatriptan (MAXALT-MLT) 10 MG disintegrating tablet    Sig: TAKE 1 TABLET AT ONSET OF HEADACHE, REPEAT IN 2 HOURS AS NEEDED (MAX 2 PER 24 HOURS)    Dispense:  6 tablet    Refill:  3  . meloxicam (MOBIC) 7.5 MG tablet    Sig: Take 1 tablet (7.5 mg total) by mouth 2 (two) times daily.    Dispense:  60 tablet    Refill:  0  . cyclobenzaprine (FLEXERIL) 10 MG tablet    Sig: Take 1 tablet (10 mg total) by mouth 2 (two) times daily as needed for muscle spasms.    Dispense:  30 tablet    Refill:  1  . buPROPion (WELLBUTRIN SR) 150 MG 12 hr tablet    Sig: Take 1 tablet (150 mg  total) by mouth 2 (two) times daily.    Dispense:  60 tablet    Refill:  3  . predniSONE (DELTASONE) 20 MG tablet    Sig: Take 2 pills daily for 3 days, then 1 pill daily for 3 days    Dispense:  9 tablet    Refill:  0       COPLAND,JESSICA, MD

## 2013-02-17 NOTE — Patient Instructions (Addendum)
Please proceed to [facility] at [address] now for your CT head.  After your CT is done you may go home, and I will call you with your results.    I have refilled your medications, and we will try wellbutrin for your depression.

## 2013-02-17 NOTE — Telephone Encounter (Signed)
Spoke to patient then spoke to Dr Patsy Lager. C T normal.

## 2013-03-02 ENCOUNTER — Other Ambulatory Visit: Payer: Self-pay | Admitting: Gynecology

## 2013-03-02 DIAGNOSIS — N63 Unspecified lump in unspecified breast: Secondary | ICD-10-CM

## 2013-03-14 ENCOUNTER — Other Ambulatory Visit: Payer: BC Managed Care – PPO

## 2013-03-16 ENCOUNTER — Ambulatory Visit
Admission: RE | Admit: 2013-03-16 | Discharge: 2013-03-16 | Disposition: A | Discharge status: Home / Self Care | Payer: BC Managed Care – PPO | Source: Ambulatory Visit | Attending: Gynecology | Admitting: Gynecology

## 2013-03-16 DIAGNOSIS — N63 Unspecified lump in unspecified breast: Secondary | ICD-10-CM

## 2013-03-30 ENCOUNTER — Other Ambulatory Visit: Payer: Self-pay | Admitting: Physician Assistant

## 2013-03-30 DIAGNOSIS — F411 Generalized anxiety disorder: Secondary | ICD-10-CM

## 2013-03-30 NOTE — Telephone Encounter (Signed)
Pt stated just seen recently, needs refill prescription for Xanax. [facility]  Pt also wants Dr Patsy Lager to know she found out what was causing head aches all the time. It was carbon monoxide poisoning in home. Any questions call pt back at [phone]

## 2013-03-31 NOTE — Telephone Encounter (Signed)
Dr Patsy Lager do you want to RF both the Mobic and Xanax?

## 2013-03-31 NOTE — Telephone Encounter (Signed)
The mobic is fine, I already approved it.  I do not see where she has been taking xanax recently.  Could you please call and find out how/ when she has been taking this.  She may be just taking very occasionally.  I can likely refill this but need details   Thanks! JC

## 2013-04-03 MED ORDER — ALPRAZOLAM 0.25 MG PO TABS: 0.2500 mg | ORAL_TABLET | Freq: Two times a day (BID) | ORAL | Status: AC | PRN

## 2013-04-03 NOTE — Telephone Encounter (Signed)
Called pt who reported that the last time you filled Rx for alprazolam was May 2012. She only uses it very occasionally and has 3 RFs left but they have expired. Correct dose is 0.25 mg prn for anxiety. Pt wanted to make sure you saw the note about her migraines, etc being caused by carbon monoxide poisoning, FYI. I advised pt that we would call her back if she needs to RTC first, otherwise we will send in RF for alprazolam if approved.

## 2013-04-03 NOTE — Addendum Note (Signed)
Addended by: Abbe Amsterdam C on: 04/03/2013 05:33 PM   Modules accepted: Orders

## 2013-06-25 ENCOUNTER — Other Ambulatory Visit: Payer: Self-pay | Admitting: Family Medicine

## 2013-07-11 ENCOUNTER — Other Ambulatory Visit: Payer: Self-pay | Admitting: Family Medicine

## 2013-07-15 ENCOUNTER — Telehealth: Payer: Self-pay

## 2013-07-15 DIAGNOSIS — F329 Major depressive disorder, single episode, unspecified: Secondary | ICD-10-CM

## 2013-07-15 NOTE — Telephone Encounter (Signed)
Copland - Pt had a refill request for the wellbutrin sent over to [place] and we denied it saying she needed an OV.  Patient says it is working excellent for her and that she is so much better than what she was.  Also she is seeing a counselor on a regular basis.  Says her insurance has just started over, so she has to meet a large deductible and is already paying out of pocket for the counselor.  Wants to know if there is any way we could call her in another refill.  Says she will come in if it is absolutely necessary.  [phone]

## 2013-07-18 MED ORDER — BUPROPION HCL ER (SR) 150 MG PO TB12: 150.0000 mg | ORAL_TABLET | Freq: Two times a day (BID) | ORAL | Status: AC

## 2013-07-18 NOTE — Telephone Encounter (Signed)
Called and LMOM- It looks like rx was refilled by [facility] on the 8th, but if not I will send in a new RF now.  We did see each other in March so I am ok with refill

## 2013-08-15 ENCOUNTER — Other Ambulatory Visit: Payer: Self-pay | Admitting: Gynecology

## 2013-08-15 DIAGNOSIS — R921 Mammographic calcification found on diagnostic imaging of breast: Secondary | ICD-10-CM

## 2013-08-19 ENCOUNTER — Other Ambulatory Visit: Payer: Self-pay | Admitting: Physician Assistant

## 2013-09-08 ENCOUNTER — Other Ambulatory Visit: Payer: Self-pay | Admitting: Family Medicine

## 2013-09-08 DIAGNOSIS — R921 Mammographic calcification found on diagnostic imaging of breast: Secondary | ICD-10-CM

## 2013-09-18 ENCOUNTER — Other Ambulatory Visit: Payer: Self-pay | Admitting: Family Medicine

## 2013-10-06 ENCOUNTER — Ambulatory Visit
Admission: RE | Admit: 2013-10-06 | Discharge: 2013-10-06 | Disposition: A | Discharge status: Home / Self Care | Payer: BC Managed Care – PPO | Source: Ambulatory Visit | Attending: Family Medicine | Admitting: Family Medicine

## 2013-10-06 DIAGNOSIS — R921 Mammographic calcification found on diagnostic imaging of breast: Secondary | ICD-10-CM

## 2013-10-27 ENCOUNTER — Telehealth: Payer: Self-pay

## 2013-10-27 NOTE — Telephone Encounter (Signed)
Dr.Copland, Pt states that her eye doctor states that vision issues (double vision x's 7 months) that she is having may be caused by Welbutrin. Pt woulld like to have your opinion before she discontinues the medication. Best# [phone]

## 2013-10-27 NOTE — Telephone Encounter (Signed)
Called back and LMOM.  I have not heard about wellbutrin causing double vision but this is certainly possible.  If she would like to try coming off the wellbutrin she can; I will try her back in the next couple of days to discuss

## 2013-10-29 NOTE — Telephone Encounter (Signed)
Spoke with her- she noted onset of double vision when she went on her wellbutrin. Her optho has tried several things and has not been able to find a solution for her.   She states that things are better for her now; she has separated from her husband and although this is sad she feels as through this has helped to decease her stress a lot.  She has actually decreased her wellbutrin to just once a day already as she always tends to forget her pm dose.  She would like to come off the wellbutrin anyway and see how she does without it.    She will take the wellbutring 150 every other day for one week, and then D/C.  She will let me know if any issues or concerns

## 2013-11-08 ENCOUNTER — Telehealth: Payer: Self-pay | Admitting: Family Medicine

## 2013-11-08 NOTE — Telephone Encounter (Signed)
Called and let her know I was not able to locate any samples.   Suggested that she contact the company directly and inquire about any pt assistance that may be available.

## 2014-02-11 ENCOUNTER — Other Ambulatory Visit: Payer: Self-pay | Admitting: Physician Assistant

## 2014-04-03 ENCOUNTER — Other Ambulatory Visit: Payer: Self-pay | Admitting: Obstetrics and Gynecology

## 2014-04-03 DIAGNOSIS — R921 Mammographic calcification found on diagnostic imaging of breast: Secondary | ICD-10-CM

## 2014-04-24 ENCOUNTER — Ambulatory Visit
Admission: RE | Admit: 2014-04-24 | Discharge: 2014-04-24 | Disposition: A | Discharge status: Home / Self Care | Payer: BC Managed Care – PPO | Source: Ambulatory Visit | Attending: Obstetrics and Gynecology | Admitting: Obstetrics and Gynecology

## 2014-04-24 DIAGNOSIS — R921 Mammographic calcification found on diagnostic imaging of breast: Secondary | ICD-10-CM

## 2015-12-27 ENCOUNTER — Encounter: Payer: Self-pay | Admitting: Family Medicine

## 2016-01-01 ENCOUNTER — Encounter: Payer: Self-pay | Admitting: Family Medicine

## 2016-03-10 ENCOUNTER — Encounter: Payer: Self-pay | Admitting: Internal Medicine
